# Patient Record
Sex: Male | Born: 1975 | Race: White | Hispanic: No | Smoking: Current every day smoker
Health system: Southern US, Community
[De-identification: ages and names within clinical notes are randomized; demographics above are authoritative.]

## PROBLEM LIST (undated history)

## (undated) DIAGNOSIS — F329 Major depressive disorder, single episode, unspecified: Secondary | ICD-10-CM

## (undated) DIAGNOSIS — F419 Anxiety disorder, unspecified: Secondary | ICD-10-CM

## (undated) DIAGNOSIS — F32A Depression, unspecified: Secondary | ICD-10-CM

---

## 2006-04-13 ENCOUNTER — Emergency Department (HOSPITAL_COMMUNITY): Admission: EM | Admit: 2006-04-13 | Discharge: 2006-04-13 | Payer: Self-pay | Admitting: Emergency Medicine

## 2009-02-25 ENCOUNTER — Emergency Department (HOSPITAL_COMMUNITY): Admission: EM | Admit: 2009-02-25 | Discharge: 2009-02-25 | Payer: Self-pay | Admitting: Emergency Medicine

## 2009-03-05 ENCOUNTER — Emergency Department (HOSPITAL_COMMUNITY): Admission: EM | Admit: 2009-03-05 | Discharge: 2009-03-05 | Payer: Self-pay | Admitting: Emergency Medicine

## 2009-03-16 ENCOUNTER — Emergency Department (HOSPITAL_COMMUNITY): Admission: EM | Admit: 2009-03-16 | Discharge: 2009-03-16 | Payer: Self-pay | Admitting: Emergency Medicine

## 2009-03-20 ENCOUNTER — Emergency Department (HOSPITAL_COMMUNITY): Admission: EM | Admit: 2009-03-20 | Discharge: 2009-03-21 | Payer: Self-pay | Admitting: Emergency Medicine

## 2009-03-21 ENCOUNTER — Inpatient Hospital Stay (HOSPITAL_COMMUNITY): Admission: AD | Admit: 2009-03-21 | Discharge: 2009-03-27 | Payer: Self-pay | Admitting: Psychiatry

## 2009-03-21 ENCOUNTER — Ambulatory Visit: Payer: Self-pay | Admitting: Psychiatry

## 2009-03-27 ENCOUNTER — Inpatient Hospital Stay (HOSPITAL_COMMUNITY): Admission: AD | Admit: 2009-03-27 | Discharge: 2009-03-31 | Payer: Self-pay | Admitting: Internal Medicine

## 2009-03-31 ENCOUNTER — Inpatient Hospital Stay (HOSPITAL_COMMUNITY): Admission: RE | Admit: 2009-03-31 | Discharge: 2009-04-10 | Payer: Self-pay | Admitting: Psychiatry

## 2009-03-31 ENCOUNTER — Ambulatory Visit: Payer: Self-pay | Admitting: Psychiatry

## 2009-07-16 ENCOUNTER — Emergency Department (HOSPITAL_COMMUNITY): Admission: EM | Admit: 2009-07-16 | Discharge: 2009-07-16 | Payer: Self-pay | Admitting: Emergency Medicine

## 2010-01-29 IMAGING — US US ABDOMEN COMPLETE
1 series · 14 of 25 positions shown · non-contrast
Comparison: None

CLINICAL DATA: Hepatitis C, liver failure.

COMPLETE ABDOMINAL ULTRASOUND

[Series 1: us abdomen complete · 0.26mm/px · 14 of 102 slices shown]
[im 1/102]
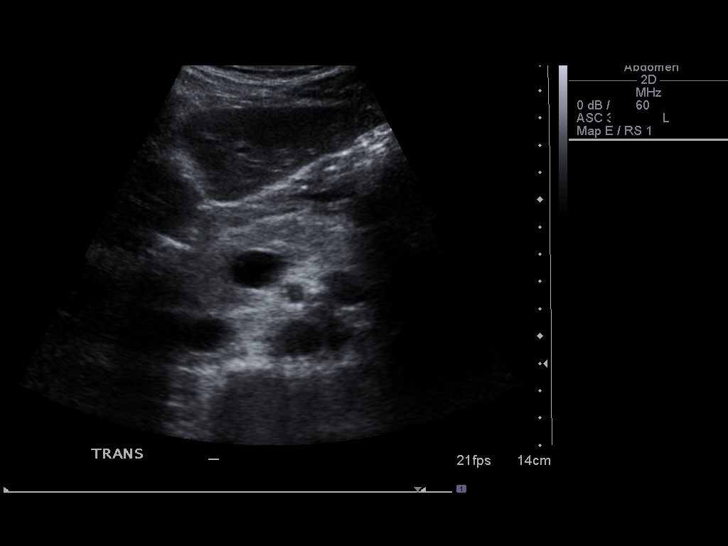
[im 9/102]
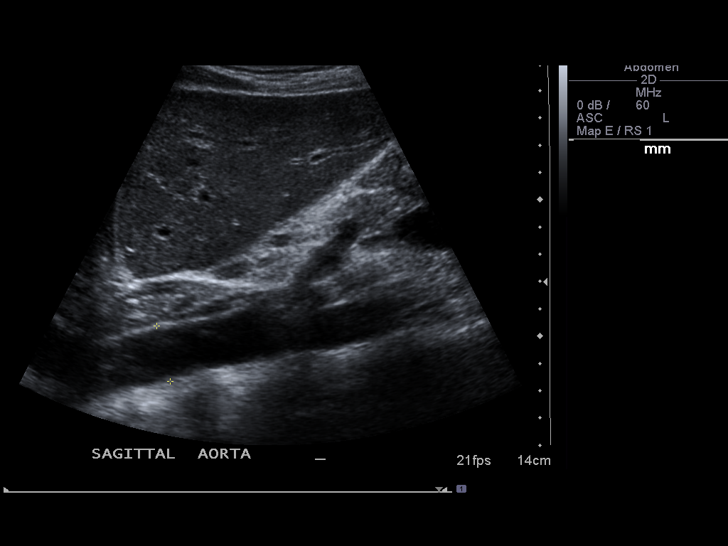
[im 17/102]
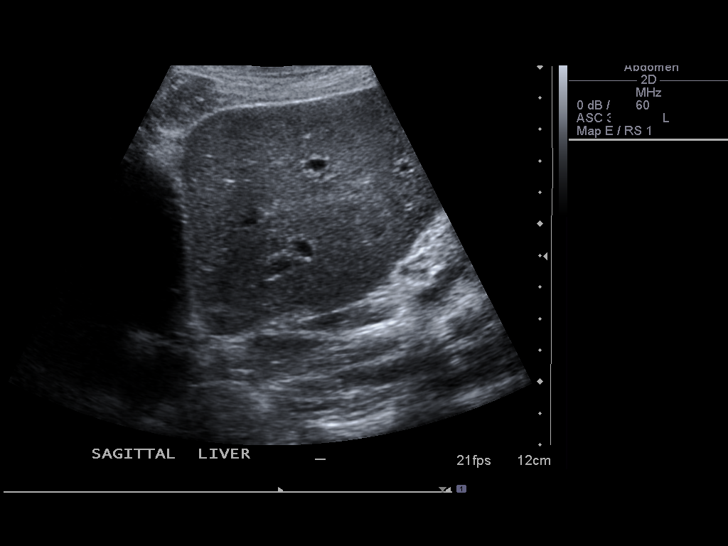
[im 26/102]
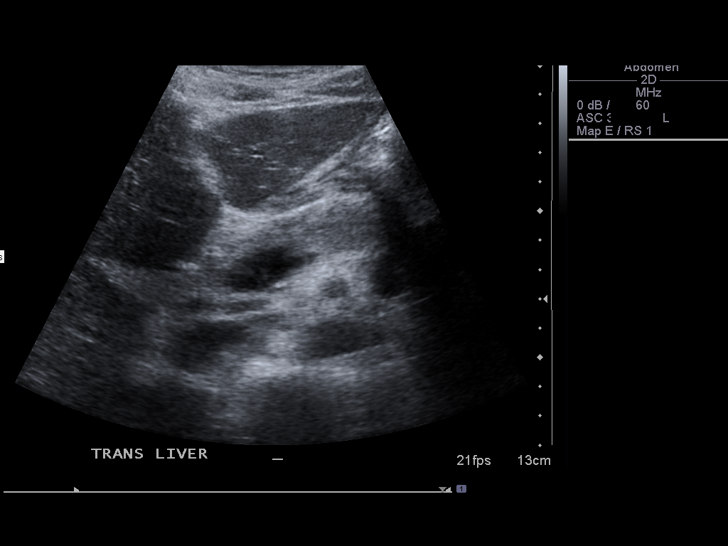
[im 34/102]
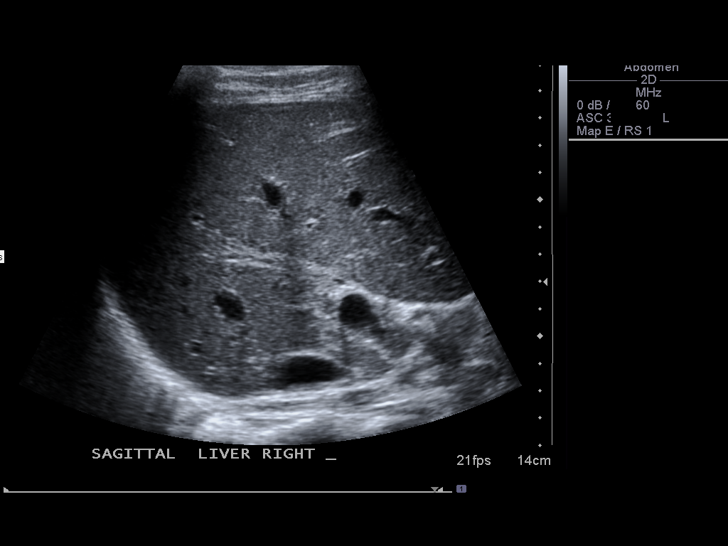
[im 38/102]
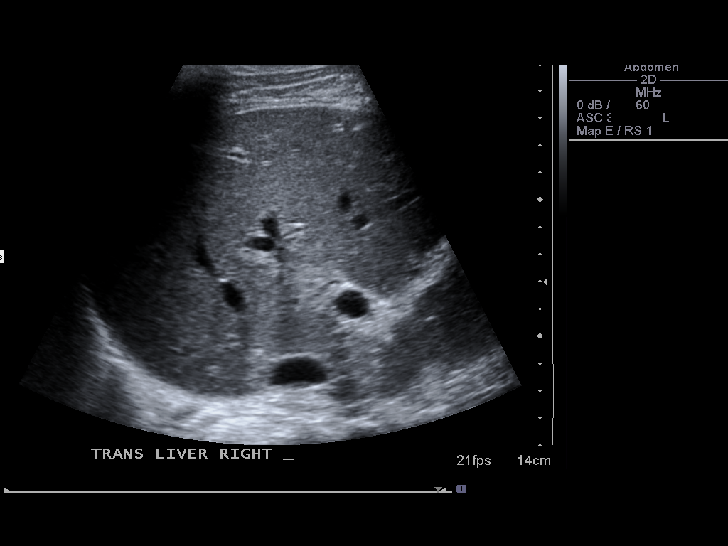
[im 47/102]
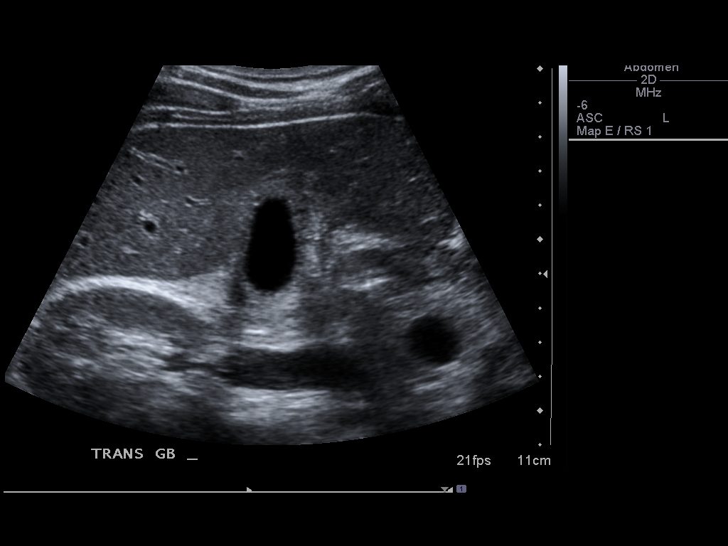
[im 55/102]
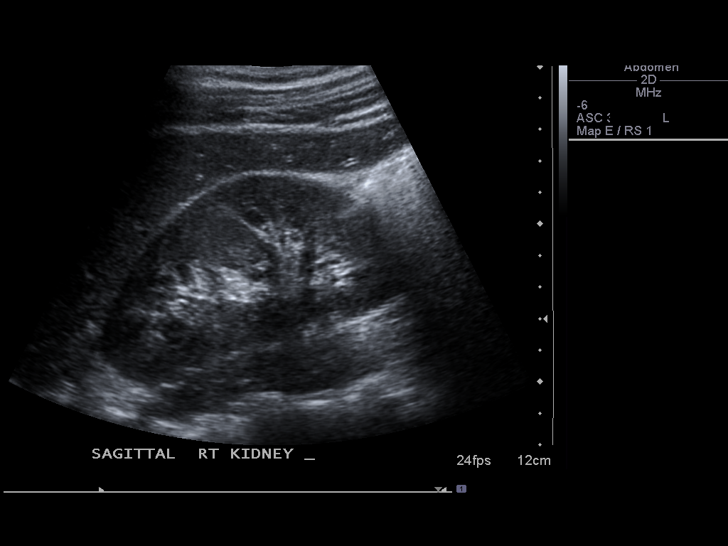
[im 64/102]
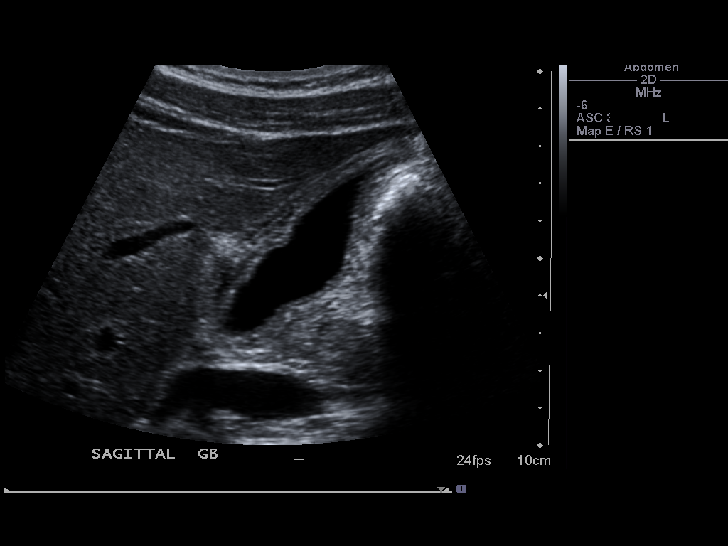
[im 68/102]
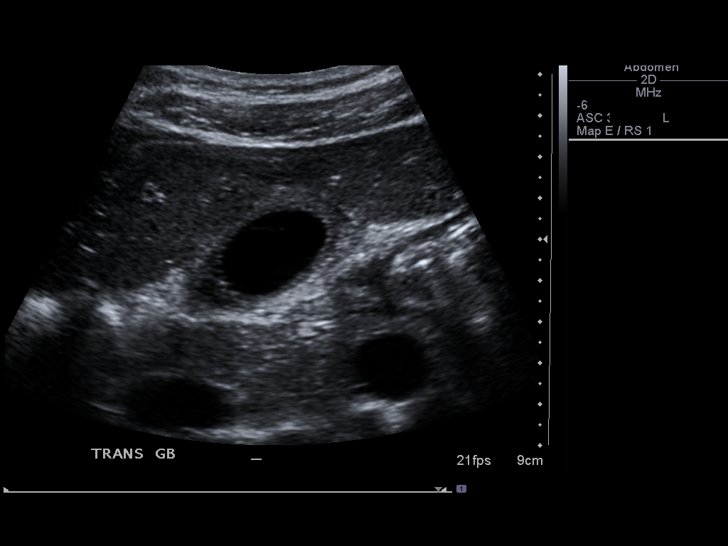
[im 76/102]
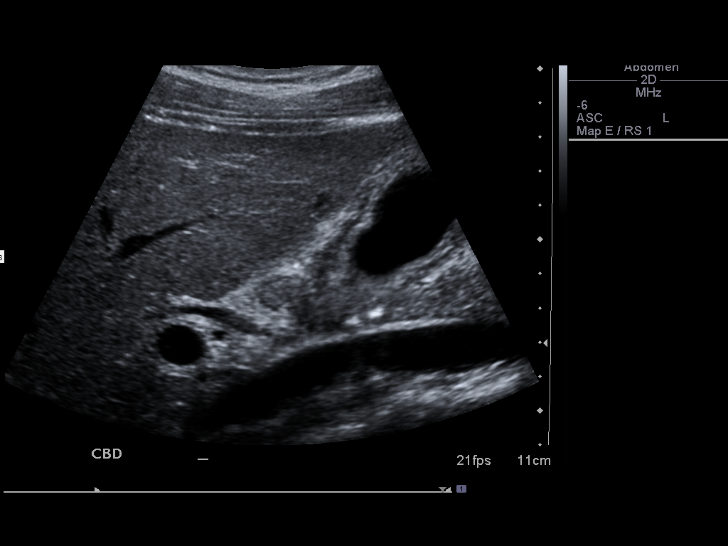
[im 85/102]
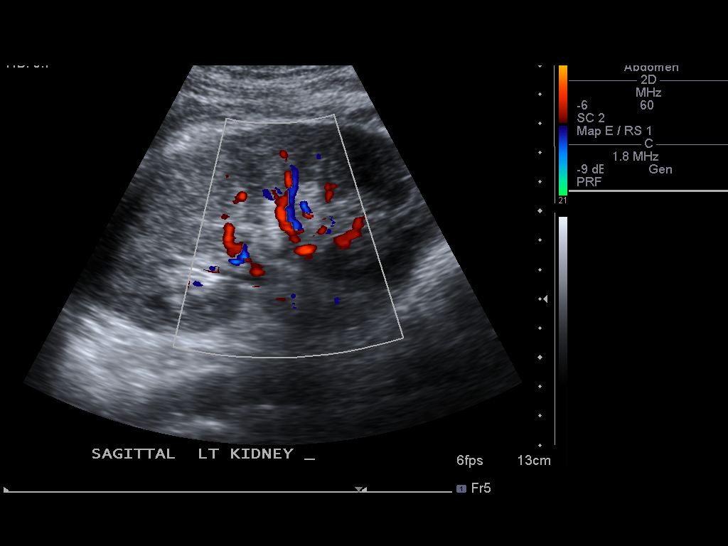
[im 93/102]
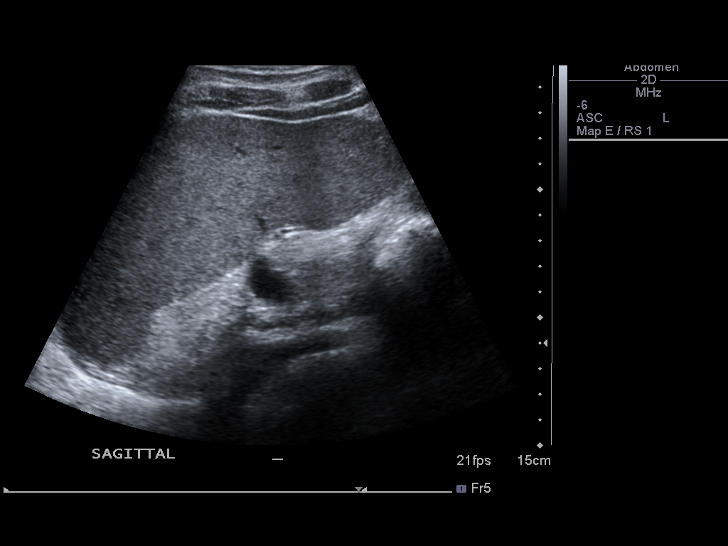
[im 102/102]
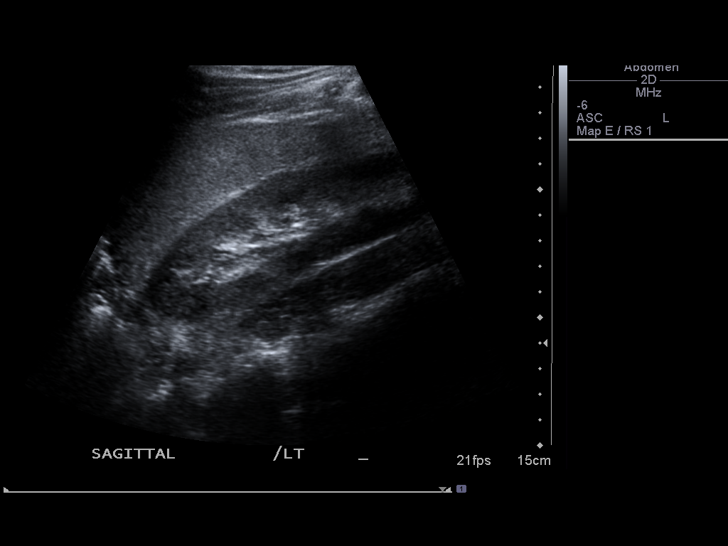

[14 of 25 positions shown; findings below may reference images not displayed]

FINDINGS: Gallbladder:  There is thickening of the gallbladder wall to 6 mm.
The gallbladder is partially contracted.  No echogenic gallstones
are present.  No pericholecystic fluid.  The patient is tender in
the region of the gallbladder and the patient is on pain
medication.

Common bile duct:  Normal in diameter 3.9 mm

Liver:  No focal lesion identified.  Within normal limits in
parenchymal echogenicity.

IVC:  Appears normal.

Pancreas:  No focal abnormality seen.

Spleen:  The spleen is enlarged with a calculated volume of 4477 ml
and a 15 cm craniocaudad length.

Right Kidney:  11.8 cm.  No hydronephrosis

Left Kidney:  13.1 cm.  No hydronephrosis

Abdominal aorta:  2.1 cm (
IMPRESSION: 1.  Thickened gallbladder wall without evidence of gallstones.
This may be related to liver failure.  No frank ascites is
demonstrated.  Cannot exclude cholecystitis.  Sonographic Murphy's
sign is equivocal as the patient is on pain medication. Consider
nuclear medicine HIDA scan if concern for acute cholecystitis.
2.  Splenomegaly.

## 2010-01-30 IMAGING — NM NM LIVER FUNCTION STUDY
1 series · 1 of 1 positions shown · non-contrast
Comparison: Ultrasound abdomen 03/28/2009

CLINICAL DATA: Hepatitis C.  Right upper quadrant abdominal pain.

NUCLEAR MEDICINE HEPATOBILIARY IMAGING
TECHNIQUE: Sequential images of the abdomen were obtained [DATE] minutes following intravenous administration of
radiopharmaceutical.
Radiopharmaceutical:  5.4 mCi Yc-PPm Choletec

[rt lat · 1 of 1 slices shown]
[im 1/1]
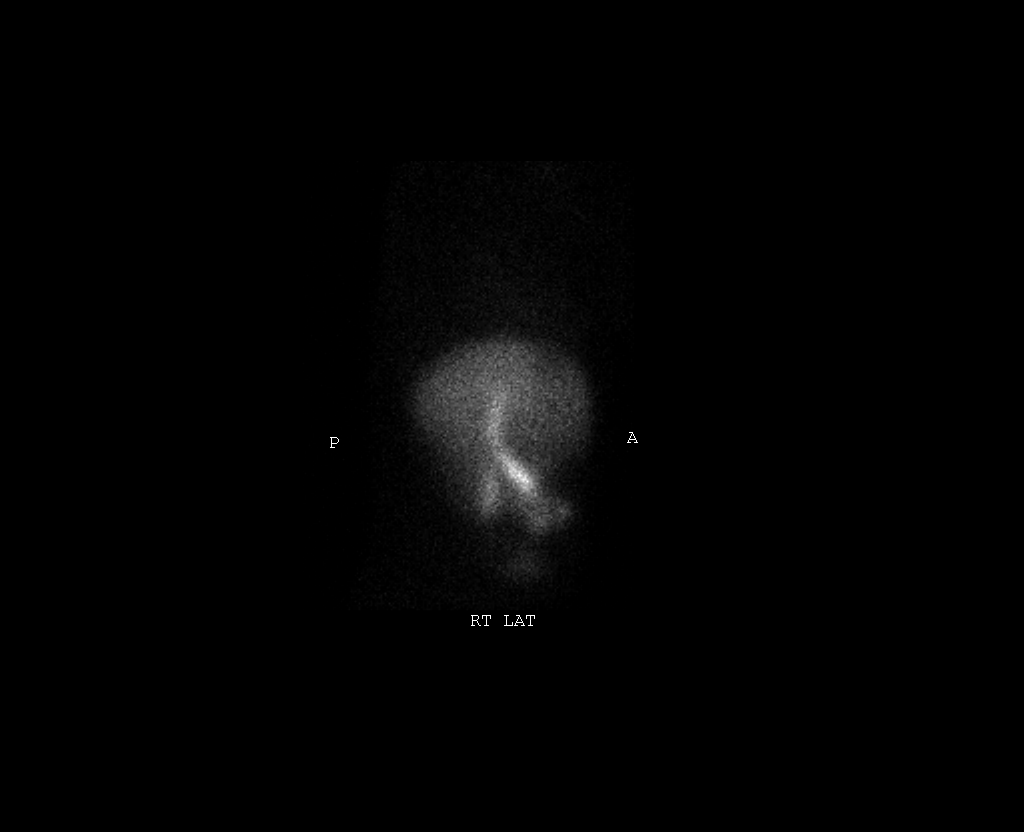

[1 of 1 positions shown; findings below may reference images not displayed]

FINDINGS: There is symmetric uptake in the liver with delayed
visualization of the biliary system at 20 minutes.  Activity is
noted in the small bowel at 45 minutes.  At 70 minutes the
gallbladder was visualized.
IMPRESSION: 1.  Patent common bile duct and cystic duct.
2.  Delayed excretion of the radiopharmaceutical from the liver
likely due to intrinsic liver disease.

## 2010-09-16 LAB — LIPASE, BLOOD: Lipase: 28 U/L (ref 11–59)

## 2010-09-16 LAB — DIFFERENTIAL
Eosinophils Absolute: 0.1 10*3/uL (ref 0.0–0.7)
Lymphocytes Relative: 28 % (ref 12–46)
Lymphs Abs: 2.3 10*3/uL (ref 0.7–4.0)
Neutro Abs: 5.1 10*3/uL (ref 1.7–7.7)
Neutrophils Relative %: 64 % (ref 43–77)

## 2010-09-16 LAB — URINALYSIS, ROUTINE W REFLEX MICROSCOPIC
Hgb urine dipstick: NEGATIVE
Nitrite: NEGATIVE
Specific Gravity, Urine: 1.008 (ref 1.005–1.030)
Urobilinogen, UA: 0.2 mg/dL (ref 0.0–1.0)
pH: 7 (ref 5.0–8.0)

## 2010-09-16 LAB — COMPREHENSIVE METABOLIC PANEL
CO2: 27 mEq/L (ref 19–32)
Calcium: 9.5 mg/dL (ref 8.4–10.5)
Creatinine, Ser: 0.93 mg/dL (ref 0.4–1.5)
GFR calc non Af Amer: >60 mL/min (ref >60)
Glucose, Bld: 101 mg/dL — ABNORMAL HIGH (ref 70–99)

## 2010-09-16 LAB — RAPID URINE DRUG SCREEN, HOSP PERFORMED
Amphetamines: NOT DETECTED
Barbiturates: POSITIVE — AB
Cocaine: NOT DETECTED
Opiates: NOT DETECTED

## 2010-09-16 LAB — CBC
Hemoglobin: 17.7 g/dL — ABNORMAL HIGH (ref 13.0–17.0)
MCHC: 35.5 g/dL (ref 30.0–36.0)
MCV: 94.6 fL (ref 78.0–100.0)
RBC: 5.27 MIL/uL (ref 4.22–5.81)
RDW: 14.5 % (ref 11.5–15.5)

## 2010-09-16 LAB — HEMOCCULT GUIAC POC 1CARD (OFFICE): Fecal Occult Bld: NEGATIVE

## 2010-10-03 LAB — COMPREHENSIVE METABOLIC PANEL
ALT: 693 U/L — ABNORMAL HIGH (ref 0–53)
AST: 561 U/L — ABNORMAL HIGH (ref 0–37)
Albumin: 3 g/dL — ABNORMAL LOW (ref 3.5–5.2)
Albumin: 3 g/dL — ABNORMAL LOW (ref 3.5–5.2)
Alkaline Phosphatase: 124 U/L — ABNORMAL HIGH (ref 39–117)
Alkaline Phosphatase: 135 U/L — ABNORMAL HIGH (ref 39–117)
BUN: 3 mg/dL — ABNORMAL LOW (ref 6–23)
BUN: 7 mg/dL (ref 6–23)
CO2: 30 mEq/L (ref 19–32)
Calcium: 9 mg/dL (ref 8.4–10.5)
Chloride: 105 mEq/L (ref 96–112)
Creatinine, Ser: 1.02 mg/dL (ref 0.4–1.5)
GFR calc Af Amer: >60 mL/min (ref >60)
GFR calc Af Amer: >60 mL/min (ref >60)
GFR calc non Af Amer: >60 mL/min (ref >60)
Glucose, Bld: 88 mg/dL (ref 70–99)
Potassium: 3.7 mEq/L (ref 3.5–5.1)
Potassium: 3.7 mEq/L (ref 3.5–5.1)
Sodium: 138 mEq/L (ref 135–145)
Sodium: 140 mEq/L (ref 135–145)
Total Bilirubin: 2.3 mg/dL — ABNORMAL HIGH (ref 0.3–1.2)
Total Protein: 5.5 g/dL — ABNORMAL LOW (ref 6.0–8.3)
Total Protein: 5.7 g/dL — ABNORMAL LOW (ref 6.0–8.3)

## 2010-10-03 LAB — HEPATIC FUNCTION PANEL
ALT: 210 U/L — ABNORMAL HIGH (ref 0–53)
AST: 85 U/L — ABNORMAL HIGH (ref 0–37)
Bilirubin, Direct: 0.4 mg/dL — ABNORMAL HIGH (ref 0.0–0.3)
Total Bilirubin: 1.3 mg/dL — ABNORMAL HIGH (ref 0.3–1.2)

## 2010-10-03 LAB — PROTIME-INR
INR: 1.1 (ref 0.00–1.49)
Prothrombin Time: 14.1 seconds (ref 11.6–15.2)
Prothrombin Time: 14.2 seconds (ref 11.6–15.2)

## 2010-10-04 LAB — CBC
HCT: 43.2 % (ref 39.0–52.0)
HCT: 43.8 % (ref 39.0–52.0)
Hemoglobin: 14.8 g/dL (ref 13.0–17.0)
MCHC: 35.1 g/dL (ref 30.0–36.0)
MCHC: 34 g/dL (ref 30.0–36.0)
MCV: 94.4 fL (ref 78.0–100.0)
MCV: 95 fL (ref 78.0–100.0)
Platelets: 150 10*3/uL (ref 150–400)
RBC: 4.57 MIL/uL (ref 4.22–5.81)
RBC: 4.61 MIL/uL (ref 4.22–5.81)
RDW: 13.8 % (ref 11.5–15.5)
WBC: 4.9 10*3/uL (ref 4.0–10.5)
WBC: 5.8 10*3/uL (ref 4.0–10.5)

## 2010-10-04 LAB — COMPREHENSIVE METABOLIC PANEL
ALT: 674 U/L — ABNORMAL HIGH (ref 0–53)
ALT: 684 U/L — ABNORMAL HIGH (ref 0–53)
ALT: 715 U/L — ABNORMAL HIGH (ref 0–53)
AST: 517 U/L — ABNORMAL HIGH (ref 0–37)
AST: 508 U/L — ABNORMAL HIGH (ref 0–37)
Albumin: 2.8 g/dL — ABNORMAL LOW (ref 3.5–5.2)
Albumin: 2.8 g/dL — ABNORMAL LOW (ref 3.5–5.2)
Albumin: 3.6 g/dL (ref 3.5–5.2)
Alkaline Phosphatase: 132 U/L — ABNORMAL HIGH (ref 39–117)
CO2: 26 mEq/L (ref 19–32)
CO2: 31 mEq/L (ref 19–32)
Calcium: 8.9 mg/dL (ref 8.4–10.5)
Chloride: 108 mEq/L (ref 96–112)
Chloride: 101 mEq/L (ref 96–112)
GFR calc Af Amer: >60 mL/min (ref >60)
GFR calc Af Amer: >60 mL/min (ref >60)
GFR calc Af Amer: >60 mL/min (ref >60)
GFR calc non Af Amer: >60 mL/min (ref >60)
GFR calc non Af Amer: >60 mL/min (ref >60)
Glucose, Bld: 76 mg/dL (ref 70–99)
Sodium: 140 mEq/L (ref 135–145)
Sodium: 138 mEq/L (ref 135–145)
Total Bilirubin: 2.2 mg/dL — ABNORMAL HIGH (ref 0.3–1.2)
Total Bilirubin: 1.6 mg/dL — ABNORMAL HIGH (ref 0.3–1.2)
Total Protein: 5.5 g/dL — ABNORMAL LOW (ref 6.0–8.3)

## 2010-10-04 LAB — HEPATIC FUNCTION PANEL
AST: 311 U/L — ABNORMAL HIGH (ref 0–37)
Albumin: 3.6 g/dL (ref 3.5–5.2)
Alkaline Phosphatase: 111 U/L (ref 39–117)
Total Bilirubin: 1.1 mg/dL (ref 0.3–1.2)

## 2010-10-04 LAB — URINALYSIS, ROUTINE W REFLEX MICROSCOPIC
Glucose, UA: NEGATIVE mg/dL
Specific Gravity, Urine: >1.03 — ABNORMAL HIGH (ref 1.005–1.030)
pH: 5.5 (ref 5.0–8.0)

## 2010-10-04 LAB — AMMONIA: Ammonia: 39 umol/L — ABNORMAL HIGH (ref 11–35)

## 2010-10-04 LAB — DIFFERENTIAL
Basophils Absolute: 0 10*3/uL (ref 0.0–0.1)
Basophils Relative: 1 % (ref 0–1)
Monocytes Absolute: 0.9 10*3/uL (ref 0.1–1.0)
Neutro Abs: 5.2 10*3/uL (ref 1.7–7.7)
Neutrophils Relative %: 66 % (ref 43–77)

## 2010-10-04 LAB — BASIC METABOLIC PANEL
BUN: 9 mg/dL (ref 6–23)
CO2: 27 mEq/L (ref 19–32)
CO2: 30 mEq/L (ref 19–32)
Calcium: 8.9 mg/dL (ref 8.4–10.5)
Chloride: 104 mEq/L (ref 96–112)
Creatinine, Ser: 0.9 mg/dL (ref 0.4–1.5)
GFR calc Af Amer: >60 mL/min (ref >60)
Glucose, Bld: 110 mg/dL — ABNORMAL HIGH (ref 70–99)
Potassium: 3.8 mEq/L (ref 3.5–5.1)
Sodium: 141 mEq/L (ref 135–145)

## 2010-10-04 LAB — RAPID URINE DRUG SCREEN, HOSP PERFORMED
Cocaine: NOT DETECTED
Opiates: POSITIVE — AB

## 2010-10-04 LAB — PROTIME-INR: Prothrombin Time: 14.7 seconds (ref 11.6–15.2)

## 2012-05-08 ENCOUNTER — Emergency Department (HOSPITAL_COMMUNITY)
Admission: EM | Admit: 2012-05-08 | Discharge: 2012-05-08 | Discharge status: Against Medical Advice | Payer: Self-pay | Attending: Emergency Medicine | Admitting: Emergency Medicine

## 2012-05-08 ENCOUNTER — Encounter (HOSPITAL_COMMUNITY): Payer: Self-pay | Admitting: Physical Medicine and Rehabilitation

## 2012-05-08 DIAGNOSIS — Z760 Encounter for issue of repeat prescription: Secondary | ICD-10-CM | POA: Insufficient documentation

## 2012-05-08 HISTORY — DX: Depression, unspecified: F32.A

## 2012-05-08 HISTORY — DX: Anxiety disorder, unspecified: F41.9

## 2012-05-08 HISTORY — DX: Major depressive disorder, single episode, unspecified: F32.9

## 2012-05-08 NOTE — ED Notes (Signed)
Pt unable to be located at the time.

## 2012-05-08 NOTE — ED Notes (Signed)
Pt presents to department for evaluation of medication refill. States he ran out of Abilify and Xanax on Tuesday, unable to get prescriptions filled until 11/15. Pt states he is anxious and diaphoretic. Denies pain at the time. He is conscious alert and oriented x4. Denies SI/HI. No signs of acute distress noted.

## 2012-05-08 NOTE — ED Notes (Addendum)
Called Pt. X 2, from the waiting room. Pt. Did not answer.  Informed Nurse 1st.

## 2012-05-08 NOTE — ED Notes (Signed)
Pt. Did not answer x 3 

## 2017-08-31 ENCOUNTER — Emergency Department (HOSPITAL_COMMUNITY)
Admission: EM | Admit: 2017-08-31 | Discharge: 2017-08-31 | Disposition: A | Discharge status: Home / Self Care | Payer: Self-pay | Attending: Emergency Medicine | Admitting: Emergency Medicine

## 2017-08-31 ENCOUNTER — Other Ambulatory Visit: Payer: Self-pay

## 2017-08-31 ENCOUNTER — Encounter (HOSPITAL_COMMUNITY): Payer: Self-pay

## 2017-08-31 DIAGNOSIS — M5431 Sciatica, right side: Secondary | ICD-10-CM | POA: Insufficient documentation

## 2017-08-31 DIAGNOSIS — F17210 Nicotine dependence, cigarettes, uncomplicated: Secondary | ICD-10-CM | POA: Insufficient documentation

## 2017-08-31 MED ORDER — KETOROLAC TROMETHAMINE 30 MG/ML IJ SOLN
30.0000 mg | Freq: Once | INTRAMUSCULAR | Status: AC
  Administered 2017-08-31: 30 mg via INTRAMUSCULAR
  Filled 2017-08-31: qty 1

## 2017-08-31 MED ORDER — TRAMADOL HCL 50 MG PO TABS: 50.0000 mg | ORAL_TABLET | Freq: Four times a day (QID) | ORAL | 0 refills | Status: AC | PRN
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
## 2017-08-31 NOTE — Discharge Instructions (Signed)
Please read attached information. If you experience any new or worsening signs or symptoms please return to the emergency room for evaluation. Please follow-up with your primary care provider or specialist as discussed. Please use medication prescribed only as directed and discontinue taking if you have any concerning signs or symptoms.   °
